# Patient Record
Sex: Female | Born: 2000 | Race: White | Hispanic: No | Marital: Single | Smoking: Never smoker
Health system: Southern US, Community
[De-identification: ages and names within clinical notes are randomized; demographics above are authoritative.]

## PROBLEM LIST (undated history)

## (undated) ENCOUNTER — Ambulatory Visit: Admission: EM | Payer: Medicaid Other | Source: Home / Self Care

## (undated) DIAGNOSIS — F419 Anxiety disorder, unspecified: Secondary | ICD-10-CM

## (undated) HISTORY — PX: TONGUE SURGERY: SHX810

## (undated) HISTORY — DX: Anxiety disorder, unspecified: F41.9

---
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
## 2021-07-06 ENCOUNTER — Ambulatory Visit
Admission: EM | Admit: 2021-07-06 | Discharge: 2021-07-06 | Disposition: A | Discharge status: Home / Self Care | Payer: Medicaid Other | Attending: Student | Admitting: Student

## 2021-07-06 ENCOUNTER — Other Ambulatory Visit: Payer: Self-pay

## 2021-07-06 VITALS — BP 123/87 | HR 94 | Temp 98.8°F | Resp 16

## 2021-07-06 DIAGNOSIS — N3000 Acute cystitis without hematuria: Secondary | ICD-10-CM

## 2021-07-06 LAB — URINALYSIS, ROUTINE W REFLEX MICROSCOPIC
Bilirubin Urine: NEGATIVE
Glucose, UA: NEGATIVE mg/dL
Hgb urine dipstick: NEGATIVE
Ketones, ur: NEGATIVE mg/dL
Nitrite: POSITIVE — AB
Specific Gravity, Urine: 1.02 (ref 1.005–1.030)
pH: 7.5 (ref 5.0–8.0)

## 2021-07-06 LAB — URINALYSIS, MICROSCOPIC (REFLEX): WBC, UA: >50 WBC/hpf (ref 0–5)

## 2021-07-06 MED ORDER — NITROFURANTOIN MONOHYD MACRO 100 MG PO CAPS: 100.0000 mg | ORAL_CAPSULE | Freq: Two times a day (BID) | ORAL | 0 refills | Status: AC

## 2021-07-06 MED ORDER — FLUCONAZOLE 150 MG PO TABS: 150.0000 mg | ORAL_TABLET | Freq: Every day | ORAL | 0 refills | Status: AC

## 2021-07-06 NOTE — ED Triage Notes (Signed)
Pt reports burning when urinating, lower back pain and increased urinary frequency x 2 days.  ?

## 2021-07-06 NOTE — ED Provider Notes (Signed)
?[facility] ? ? ? ?CSN: [id number] ?Arrival date & time: 07/06/21  1700 ? ? ?  ? ?History   ?Chief Complaint ?Chief Complaint  ?Patient presents with  ? Dysuria  ? ? ?HPI ?Kristin Holt is a 21 y.o. female presenting with dysuria and frequency for 2 days following treatment for BV with metronidazole cream.  History UTI greater than 3 years ago.  Describes dysuria, frequency, lower back pain.  Denies hematuria, abdominal pain, fever/chills, vaginal rash or lesion.  States her BV symptoms cleared up, and she does not have any vaginal discharge or odor at time of visit.  Denies partners, states she is not sexually active.  OCP contraception ? ?HPI ? ?Past Medical History:  ?Diagnosis Date  ? Anxiety   ? ? ?Patient Active Problem List  ? Diagnosis Date Noted  ? 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 Migraine without aura and without status migrainosus, not intractable 07/28/2018  ? Complicated migraine 07/28/2018  ? Episodic tension-type headache, not intractable 07/28/2018  ? Poor sleep hygiene 07/28/2018  ? ? ?Past Surgical History:  ?Procedure Laterality Date  ? TONGUE SURGERY    ? ? ?OB History   ? ? Gravida  ?0  ? Para  ?0  ? Term  ?0  ? Preterm  ?0  ? AB  ?0  ? Living  ?0  ?  ? ? SAB  ?0  ? IAB  ?0  ? Ectopic  ?0  ? Multiple  ?0  ? Live Births  ?0  ?   ?  ?  ? ? ? ?Home Medications   ? ?Prior to Admission medications   ?Medication Sig Start Date End Date Taking? Authorizing Provider  ?fluconazole (DIFLUCAN) 150 MG tablet Take 1 tablet (150 mg total) by mouth daily. If symptoms develop - take one pill of diflucan. If symptoms persist, take second pill 3 days later. 07/06/21  Yes Rhys Martini, PA-C  ?nitrofurantoin, macrocrystal-monohydrate, (MACROBID) 100 MG capsule Take 1 capsule (100 mg total) by mouth 2 (two) times daily. 07/06/21  Yes Rhys Martini, PA-C  ?TRI-SPRINTEC 0.18/0.215/0.25 MG-35 MCG tablet Take 1 tablet by mouth daily. 10/29/19   [provider]  ? ? ?Family History ?Family History  ?Problem  Relation Age of Onset  ? Migraines Mother   ? Migraines Paternal Grandmother   ? Breast cancer Paternal Grandmother   ?     twice  ? ? ?Social History ?Social History  ? ?Tobacco Use  ? Smoking status: Never  ? Smokeless tobacco: Never  ?Vaping Use  ? Vaping Use: Never used  ?Substance Use Topics  ? Alcohol use: No  ? Drug use: Never  ? ? ? ?Allergies   ?Amoxicillin-pot clavulanate ? ? ?Review of Systems ?Review of Systems  ?Constitutional:  Negative for appetite change, chills, diaphoresis and fever.  ?Respiratory:  Negative for shortness of breath.   ?Cardiovascular:  Negative for chest pain.  ?Gastrointestinal:  Negative for abdominal pain, blood in stool, constipation, diarrhea, nausea and vomiting.  ?Genitourinary:  Positive for dysuria. Negative for decreased urine volume, difficulty urinating, flank pain, frequency, genital sores, hematuria and urgency.  ?Musculoskeletal:  Negative for back pain.  ?Neurological:  Negative for dizziness, weakness and light-headedness.  ?All other systems reviewed and are negative. ? ? ?Physical Exam ?Triage Vital Signs ?ED Triage Vitals  ?Enc Vitals Group  ?   BP 07/06/21 1726 123/87  ?   Pulse Rate 07/06/21 1726 94  ?   Resp 07/06/21 1726  16  ?   Temp 07/06/21 1726 98.8 ?F (37.1 ?C)  ?   Temp Source 07/06/21 1726 Oral  ?   SpO2 07/06/21 1726 98 %  ?   Weight --   ?   Height --   ?   Head Circumference --   ?   Peak Flow --   ?   Pain Score 07/06/21 1727 8  ?   Pain Loc --   ?   Pain Edu? --   ?   Excl. in GC? --   ? ?No data found. ? ?Updated Vital Signs ?BP 123/87 (BP Location: Left Arm)   Pulse 94   Temp 98.8 ?F (37.1 ?C) (Oral)   Resp 16   SpO2 98%  ? ?Visual Acuity ?Right Eye Distance:   ?Left Eye Distance:   ?Bilateral Distance:   ? ?Right Eye Near:   ?Left Eye Near:    ?Bilateral Near:    ? ?Physical Exam ?Vitals reviewed.  ?Constitutional:   ?   General: She is not in acute distress. ?   Appearance: Normal appearance. She is not ill-appearing.  ?HENT:  ?   Head:  Normocephalic and atraumatic.  ?   Mouth/Throat:  ?   Mouth: Mucous membranes are moist.  ?   Comments: Moist mucous membranes ?Eyes:  ?   Extraocular Movements: Extraocular movements intact.  ?   Pupils: Pupils are equal, round, and reactive to light.  ?Cardiovascular:  ?   Rate and Rhythm: Normal rate and regular rhythm.  ?   Heart sounds: Normal heart sounds.  ?Pulmonary:  ?   Effort: Pulmonary effort is normal.  ?   Breath sounds: Normal breath sounds. No wheezing, rhonchi or rales.  ?Abdominal:  ?   General: Bowel sounds are normal. There is no distension.  ?   Palpations: Abdomen is soft. There is no mass.  ?   Tenderness: There is no abdominal tenderness. There is no right CVA tenderness, left CVA tenderness, guarding or rebound.  ?Skin: ?   General: Skin is warm.  ?   Capillary Refill: Capillary refill takes less than 2 seconds.  ?   Comments: Good skin turgor  ?Neurological:  ?   General: No focal deficit present.  ?   Mental Status: She is alert and oriented to person, place, and time.  ?Psychiatric:     ?   Mood and Affect: Mood normal.     ?   Behavior: Behavior normal.  ? ? ? ?UC Treatments / Results  ?Labs ?(all labs ordered are listed, but only abnormal results are displayed) ?Labs Reviewed  ?URINALYSIS, ROUTINE W REFLEX MICROSCOPIC - Abnormal; Notable for the following components:  ?    Result Value  ? Protein, ur TRACE (*)   ? Nitrite POSITIVE (*)   ? Leukocytes,Ua SMALL (*)   ? All other components within normal limits  ?URINALYSIS, MICROSCOPIC (REFLEX) - Abnormal; Notable for the following components:  ? Bacteria, UA FEW (*)   ? All other components within normal limits  ?URINE CULTURE  ? ? ?EKG ? ? ?Radiology ?No results found. ? ?Procedures ?Procedures (including critical care time) ? ?Medications Ordered in UC ?Medications - No data to display ? ?Initial Impression / Assessment and Plan / UC Course  ?I have reviewed the triage vital signs and the nursing notes. ? ?Pertinent labs & imaging  results that were available during my care of the patient were reviewed by me and considered in my  medical decision making (see chart for details). ? ?  ? ?This patient is a very pleasant 21 y.o. year old female presenting with acute cystitis. Afebrile, nontachycardic, no reproducible abd pain or CVAT. ? ?UA with positive nitrite and small leuk. Last Azo 1 day ago. Culture sent.  ? ?Will manage as UTI with Macrobid as she is penicillin allergic. Diflucan sent to have on hand.  ? ?ED return precautions discussed. Patient verbalizes understanding and agreement.  ?.  ? ?Final Clinical Impressions(s) / UC Diagnoses  ? ?Final diagnoses:  ?Acute cystitis without hematuria  ? ? ? ?Discharge Instructions   ? ?  ?-Macrobid twice daily x5 days ?-Drink plenty of fluids  ?-Diflucan if you develop white discharge and/or itchy rash.  ?-Follow-up with us or gyn if symptoms persist or new symptoms. ? ? ?ED Prescriptions   ? ? Medication Sig Dispense Auth. Provider  ? nitrofurantoin, macrocrystal-monohydrate, (MACROBID) 100 MG capsule Take 1 capsule (100 mg total) by mouth 2 (two) times daily. 10 capsule Rhys MartiniGraham, Fremon Zacharia E, PA-C  ? fluconazole (DIFLUCAN) 150 MG tablet Take 1 tablet (150 mg total) by mouth daily. If symptoms develop - take one pill of diflucan. If symptoms persist, take second pill 3 days later. 2 tablet Rhys MartiniGraham, Gerica Koble E, PA-C  ? ?  ? ?PDMP not reviewed this encounter. ?  ?Rhys MartiniGraham, Jawuan Robb E, PA-C ?07/06/21 1816 ? ?

## 2021-07-06 NOTE — Discharge Instructions (Signed)
-  Macrobid twice daily x5 days ?-Drink plenty of fluids  ?-Diflucan if you develop white discharge and/or itchy rash.  ?-Follow-up with [place] or gyn if symptoms persist or new symptoms. ?

## 2021-07-09 LAB — URINE CULTURE: Culture: 40000 — AB

## 2021-09-11 ENCOUNTER — Telehealth: Payer: Medicaid Other | Admitting: Family Medicine

## 2021-09-11 DIAGNOSIS — N3000 Acute cystitis without hematuria: Secondary | ICD-10-CM | POA: Diagnosis not present

## 2021-09-11 IMAGING — US US PELVIS COMPLETE WITH TRANSVAGINAL
1 series · 14 of 25 positions shown · non-contrast
Comparison: None

CLINICAL DATA: Dysmenorrhea and generalized pain for 1 year, LMP
10/03/2019

EXAM:
TRANSABDOMINAL AND TRANSVAGINAL ULTRASOUND OF PELVIS
TECHNIQUE: Both transabdominal and transvaginal ultrasound examinations of the
pelvis were performed. Transabdominal technique was performed for
global imaging of the pelvis including uterus, ovaries, adnexal
regions, and pelvic cul-de-sac. It was necessary to proceed with
endovaginal exam following the transabdominal exam to visualize the
uterus and cervix.

[Series 1: us pelvic complete with transvaginal · 14 of 86 slices shown]
[im 1/86]
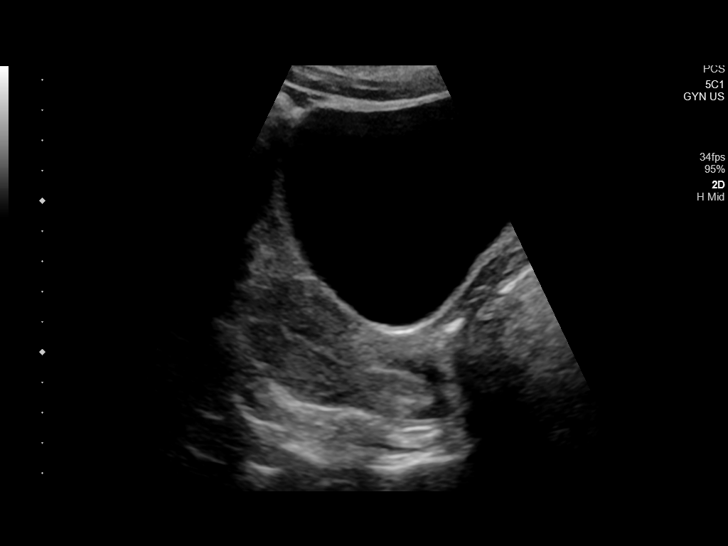
[im 8/86]
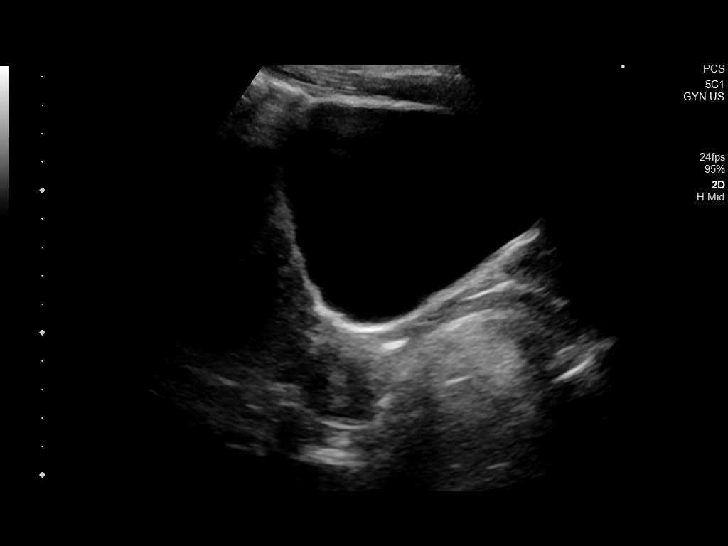
[im 15/86]
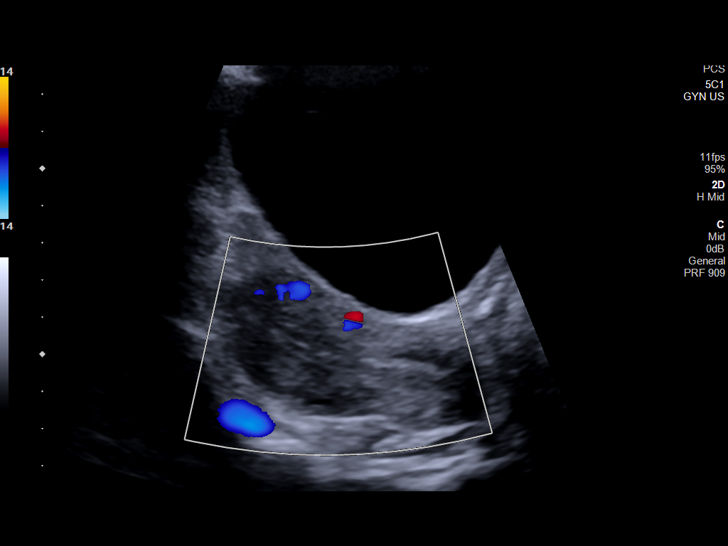
[im 22/86]
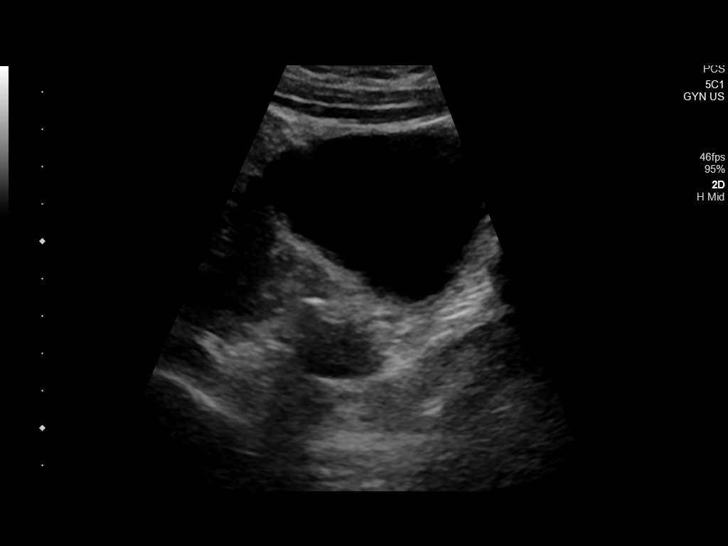
[im 29/86]
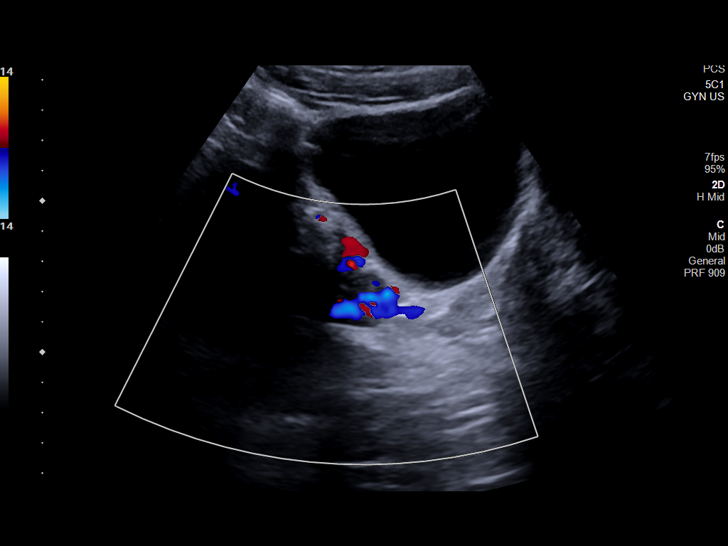
[im 32/86]
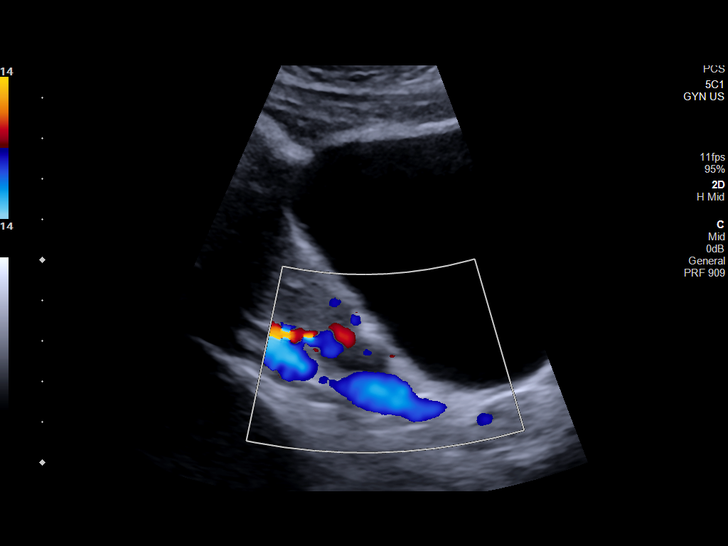
[im 39/86]
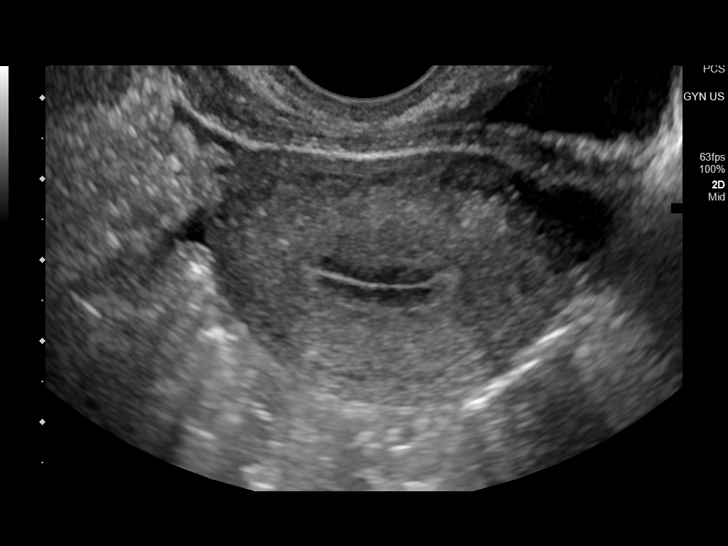
[im 47/86]
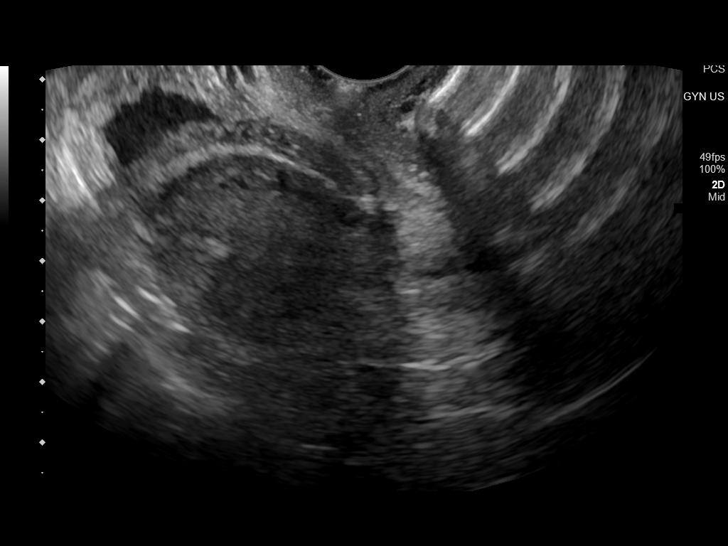
[im 54/86]
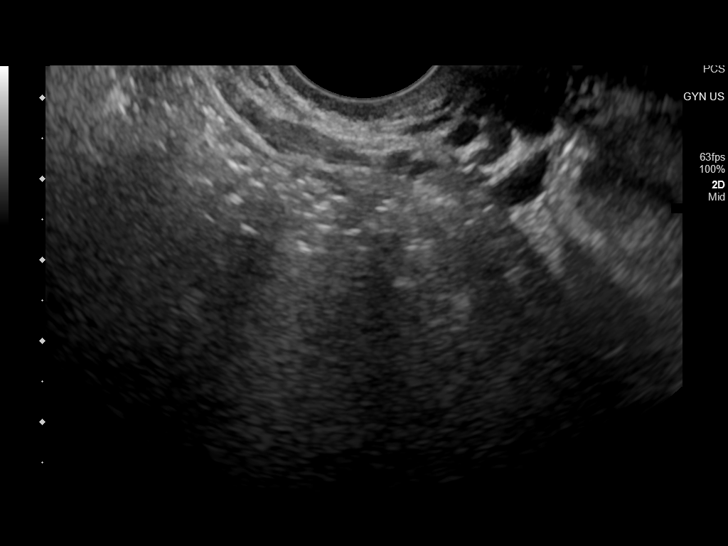
[im 57/86]
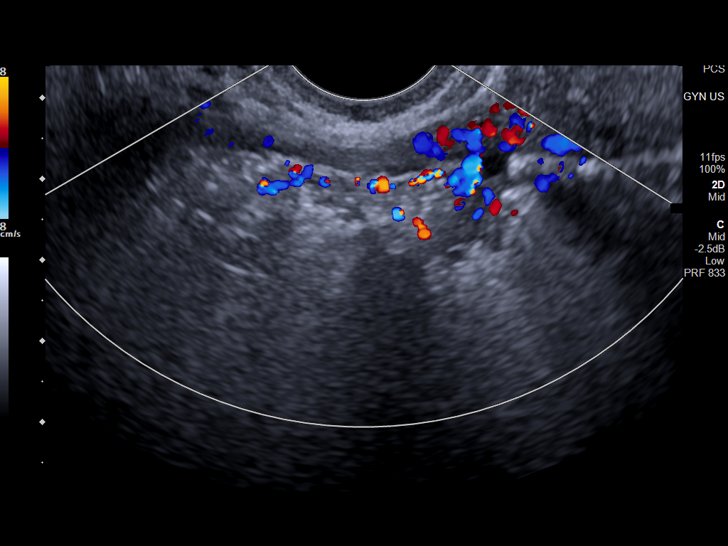
[im 64/86]
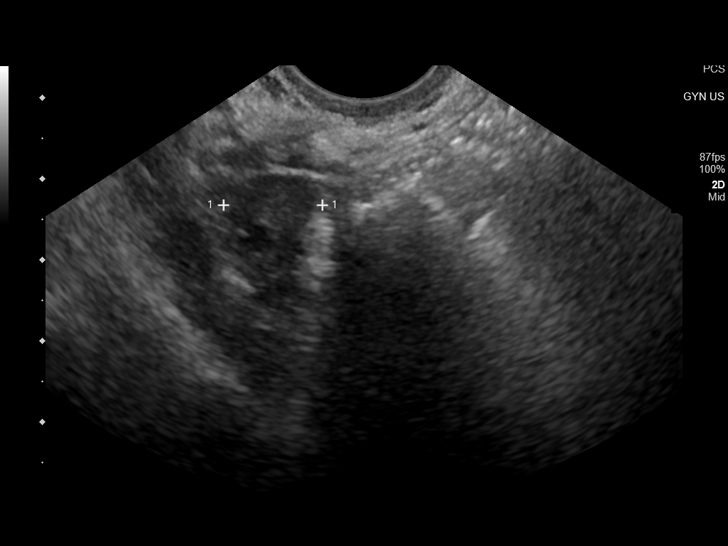
[im 71/86]
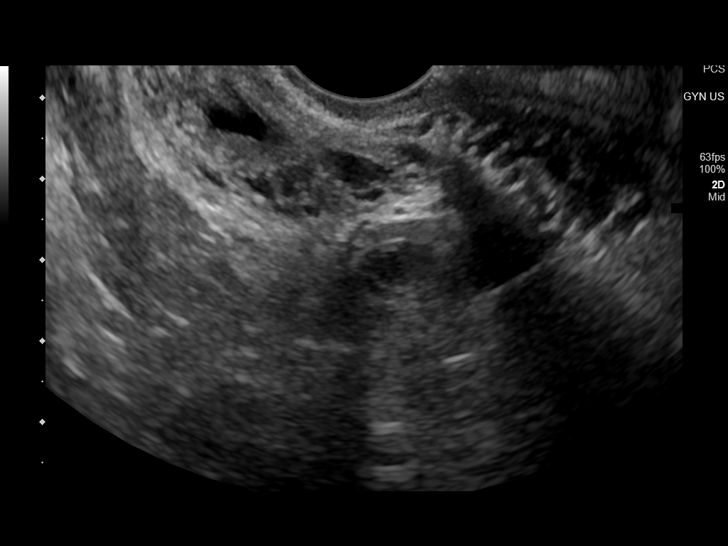
[im 78/86]
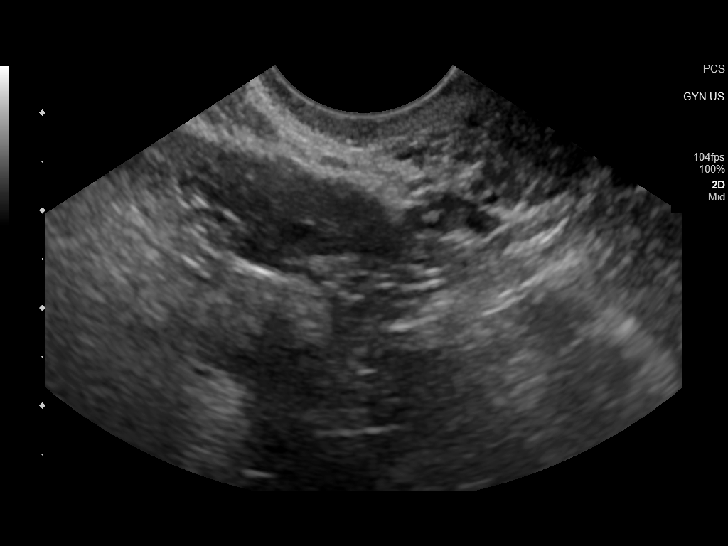
[im 86/86]
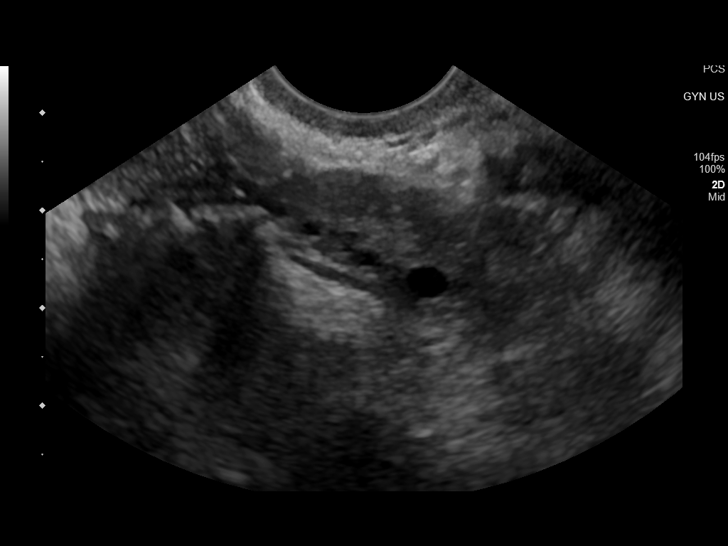

[14 of 25 positions shown; findings below may reference images not displayed]

FINDINGS: Uterus

Measurements: 7.7 x 3.7 x 4.6 cm = volume: 68 mL. Anteverted. Normal
morphology without mass.

Endometrium

Thickness: 7 mm.  No endometrial fluid or focal abnormality

Right ovary

Measurements: 1.7 x 2.1 x 1.2 cm = volume: 2.3 mL. Normal morphology
without mass

Left ovary

Measurements: 2.6 x 1.4 x 2.3 cm = volume: 4.2 mL. Normal morphology
without mass

Other findings

No free pelvic fluid.  No adnexal masses.
IMPRESSION: Normal exam.

## 2021-09-11 MED ORDER — NITROFURANTOIN MONOHYD MACRO 100 MG PO CAPS: 100.0000 mg | ORAL_CAPSULE | Freq: Two times a day (BID) | ORAL | 0 refills | Status: AC

## 2021-09-11 NOTE — Patient Instructions (Signed)
Urinary Tract Infection, Adult  A urinary tract infection (UTI) is an infection of any part of the urinary tract. The urinary tract includes the kidneys, ureters, bladder, and urethra. These organs make, store, and get rid of urine in the body. An upper UTI affects the ureters and kidneys. A lower UTI affects the bladder and urethra. What are the causes? Most urinary tract infections are caused by bacteria in your genital area around your urethra, where urine leaves your body. These bacteria grow and cause inflammation of your urinary tract. What increases the risk? You are more likely to develop this condition if: You have a urinary catheter that stays in place. You are not able to control when you urinate or have a bowel movement (incontinence). You are female and you: Use a spermicide or diaphragm for birth control. Have low estrogen levels. Are pregnant. You have certain genes that increase your risk. You are sexually active. You take antibiotic medicines. You have a condition that causes your flow of urine to slow down, such as: An enlarged prostate, if you are female. Blockage in your urethra. A kidney stone. A nerve condition that affects your bladder control (neurogenic bladder). Not getting enough to drink, or not urinating often. You have certain medical conditions, such as: Diabetes. A weak disease-fighting system (immunesystem). Sickle cell disease. Gout. Spinal cord injury. What are the signs or symptoms? Symptoms of this condition include: Needing to urinate right away (urgency). Frequent urination. This may include small amounts of urine each time you urinate. Pain or burning with urination. Blood in the urine. Urine that smells bad or unusual. Trouble urinating. Cloudy urine. Vaginal discharge, if you are female. Pain in the abdomen or the lower back. You may also have: Vomiting or a decreased appetite. Confusion. Irritability or tiredness. A fever or  chills. Diarrhea. The first symptom in older adults may be confusion. In some cases, they may not have any symptoms until the infection has worsened. How is this diagnosed? This condition is diagnosed based on your medical history and a physical exam. You may also have other tests, including: Urine tests. Blood tests. Tests for STIs (sexually transmitted infections). If you have had more than one UTI, a cystoscopy or imaging studies may be done to determine the cause of the infections. How is this treated? Treatment for this condition includes: Antibiotic medicine. Over-the-counter medicines to treat discomfort. Drinking enough water to stay hydrated. If you have frequent infections or have other conditions such as a kidney stone, you may need to see a health care provider who specializes in the urinary tract (urologist). In rare cases, urinary tract infections can cause sepsis. Sepsis is a life-threatening condition that occurs when the body responds to an infection. Sepsis is treated in the hospital with IV antibiotics, fluids, and other medicines. Follow these instructions at home:  Medicines Take over-the-counter and prescription medicines only as told by your health care provider. If you were prescribed an antibiotic medicine, take it as told by your health care provider. Do not stop using the antibiotic even if you start to feel better. General instructions Make sure you: Empty your bladder often and completely. Do not hold urine for long periods of time. Empty your bladder after sex. Wipe from front to back after urinating or having a bowel movement if you are female. Use each tissue only one time when you wipe. Drink enough fluid to keep your urine pale yellow. Keep all follow-up visits. This is important. Contact a health   care provider if: Your symptoms do not get better after 1-2 days. Your symptoms go away and then return. Get help right away if: You have severe pain in  your back or your lower abdomen. You have a fever or chills. You have nausea or vomiting. Summary A urinary tract infection (UTI) is an infection of any part of the urinary tract, which includes the kidneys, ureters, bladder, and urethra. Most urinary tract infections are caused by bacteria in your genital area. Treatment for this condition often includes antibiotic medicines. If you were prescribed an antibiotic medicine, take it as told by your health care provider. Do not stop using the antibiotic even if you start to feel better. Keep all follow-up visits. This is important. This information is not intended to replace advice given to you by your health care provider. Make sure you discuss any questions you have with your health care provider. Document Revised: 11/02/2019 Document Reviewed: 11/02/2019 Elsevier Patient Education  2023 Elsevier Inc.  

## 2021-09-11 NOTE — Progress Notes (Signed)
Virtual Visit Consent   Kristin Holt, you are scheduled for a virtual visit with a [facility] provider today. Just as with appointments in the office, your consent must be obtained to participate. Your consent will be active for this visit and any virtual visit you may have with one of our providers in the next 365 days. If you have a MyChart account, a copy of this consent can be sent to you electronically.  As this is a virtual visit, video technology does not allow for your provider to perform a traditional examination. This may limit your provider's ability to fully assess your condition. If your provider identifies any concerns that need to be evaluated in person or the need to arrange testing (such as labs, EKG, etc.), we will make arrangements to do so. Although advances in technology are sophisticated, we cannot ensure that it will always work on either your end or our end. If the connection with a video visit is poor, the visit may have to be switched to a telephone visit. With either a video or telephone visit, we are not always able to ensure that we have a secure connection.  By engaging in this virtual visit, you consent to the provision of healthcare and authorize for your insurance to be billed (if applicable) for the services provided during this visit. Depending on your insurance coverage, you may receive a charge related to this service.  I need to obtain your verbal consent now. Are you willing to proceed with your visit today? Kristin Holt has provided verbal consent on 09/11/2021 for a virtual visit (video or telephone). Kristin Curio, FNP  Date: 09/11/2021 12:30 PM  Virtual Visit via Video Note   I, Kristin Holt, connected with  Kristin Holt  ([id number], Jul 21, 2000) on 09/11/21 at 12:45 PM EDT by a video-enabled telemedicine application and verified that I am speaking with the correct person using two identifiers.  Location: Patient: Virtual Visit Location Patient: Home Provider:  Virtual Visit Location Provider: Home Office   I discussed the limitations of evaluation and management by telemedicine and the availability of in person appointments. The patient expressed understanding and agreed to proceed.    History of Present Illness: Kristin Holt is a 21 y.o. who identifies as a female who was assigned female at birth, and is being seen today for dysuria, frequency for several days worsening. No fever. No back pain. 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  Resting comfortably  at home.  Head is normocephalic, atraumatic.  No labored breathing.  Speech is clear and coherent with logical content.  Patient is alert and oriented at baseline.    Assessment and Plan: 1. Acute cystitis without hematuria  Increase fluids, preventative measures discussed. Follow up at urgent care as needed.   Follow Up Instructions: I discussed the assessment and treatment plan with the patient. The patient was provided an opportunity to ask questions and all were answered. The patient agreed with the plan and demonstrated an understanding of the instructions.  A copy of instructions were sent  to the patient via MyChart unless otherwise noted below.     The patient was advised to call back or seek an in-person evaluation if the symptoms worsen or if the condition fails to improve as anticipated.  Time:  I spent 10 minutes with the patient via telehealth technology discussing the above problems/concerns.     Kristin Curio, FNP

## 2021-10-30 ENCOUNTER — Other Ambulatory Visit: Payer: Self-pay | Admitting: Family Medicine

## 2021-10-30 DIAGNOSIS — H5500 Unspecified nystagmus: Secondary | ICD-10-CM

## 2021-10-30 DIAGNOSIS — G43809 Other migraine, not intractable, without status migrainosus: Secondary | ICD-10-CM

## 2021-11-12 ENCOUNTER — Ambulatory Visit
Admission: RE | Admit: 2021-11-12 | Discharge: 2021-11-12 | Disposition: A | Discharge status: Home / Self Care | Payer: Medicaid Other | Source: Ambulatory Visit | Attending: Family Medicine | Admitting: Family Medicine

## 2021-11-12 DIAGNOSIS — G43809 Other migraine, not intractable, without status migrainosus: Secondary | ICD-10-CM

## 2021-11-12 DIAGNOSIS — H5500 Unspecified nystagmus: Secondary | ICD-10-CM

## 2021-11-24 ENCOUNTER — Other Ambulatory Visit: Payer: Medicaid Other

## 2022-03-17 ENCOUNTER — Other Ambulatory Visit: Payer: Medicaid Other

## 2022-04-20 ENCOUNTER — Other Ambulatory Visit: Payer: Self-pay | Admitting: Physician Assistant

## 2022-04-20 DIAGNOSIS — H539 Unspecified visual disturbance: Secondary | ICD-10-CM

## 2022-04-20 DIAGNOSIS — R42 Dizziness and giddiness: Secondary | ICD-10-CM

## 2022-04-20 DIAGNOSIS — G43409 Hemiplegic migraine, not intractable, without status migrainosus: Secondary | ICD-10-CM

## 2022-04-27 ENCOUNTER — Ambulatory Visit
Admission: RE | Admit: 2022-04-27 | Discharge: 2022-04-27 | Disposition: A | Discharge status: Home / Self Care | Payer: Medicaid Other | Source: Ambulatory Visit | Attending: Physician Assistant | Admitting: Physician Assistant

## 2022-04-27 ENCOUNTER — Other Ambulatory Visit: Payer: Self-pay | Admitting: Physician Assistant

## 2022-04-27 DIAGNOSIS — H539 Unspecified visual disturbance: Secondary | ICD-10-CM | POA: Diagnosis present

## 2022-04-27 DIAGNOSIS — R42 Dizziness and giddiness: Secondary | ICD-10-CM | POA: Insufficient documentation

## 2022-04-27 DIAGNOSIS — G43409 Hemiplegic migraine, not intractable, without status migrainosus: Secondary | ICD-10-CM | POA: Diagnosis not present

## 2022-08-29 ENCOUNTER — Ambulatory Visit
Admission: EM | Admit: 2022-08-29 | Discharge: 2022-08-29 | Disposition: A | Discharge status: Home / Self Care | Payer: Medicaid Other

## 2022-08-29 DIAGNOSIS — J029 Acute pharyngitis, unspecified: Secondary | ICD-10-CM

## 2022-08-29 DIAGNOSIS — J069 Acute upper respiratory infection, unspecified: Secondary | ICD-10-CM | POA: Diagnosis not present

## 2022-08-29 NOTE — ED Provider Notes (Signed)
Kristin Holt    CSN: [id number] Arrival date & time: 08/29/22  1107      History   Chief Complaint Chief Complaint  Patient presents with   Sore Throat    bad throat pain. hurts to swallow, body aches, headache, runny nose - Entered by patient   Generalized Body Aches   Headache    HPI Kristin Holt is a 22 y.o. female.    Sore Throat Associated symptoms include headaches.  Headache   Patient presents to urgent care with complaint of throat irritation x 2 days..  She endorses sore throat, body aches, headache last night.  She also reports mild nasal congestion with postnasal drip.  Denies rhinorrhea.  Denies fever.  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 Authorizing Provider  fluconazole (DIFLUCAN) 150 MG tablet Take 1 tablet (150 mg total) by mouth daily. If symptoms develop - take one pill of diflucan. If symptoms persist, take second pill 3 days later. 07/06/21   Rhys Martini, PA-C  nitrofurantoin, macrocrystal-monohydrate, (MACROBID) 100 MG capsule Take 1 capsule (100 mg total) by mouth 2 (two) times daily. 07/06/21   Rhys Martini, PA-C  TRI-SPRINTEC 0.18/0.215/0.25 MG-35 MCG tablet Take 1 tablet by mouth daily. 10/29/19   [provider]    Family History Family History  Problem Relation Age  of Onset   Migraines Mother    Migraines Paternal Grandmother    Breast cancer Paternal Grandmother        twice    Social History Social History   Tobacco Use   Smoking status: Never   Smokeless tobacco: Never  Vaping Use   Vaping Use: Never used  Substance Use Topics   Alcohol use: No   Drug use: Never     Allergies   Amoxicillin-pot clavulanate   Review of Systems Review of Systems  Neurological:  Positive for headaches.     Physical Exam Triage Vital Signs ED Triage Vitals  Enc Vitals Group     BP 08/29/22 1154 122/85     Pulse Rate 08/29/22 1154 84     Resp 08/29/22 1154 16     Temp 08/29/22 1154 98.3 F (36.8 C)     Temp Source 08/29/22 1154 Oral     SpO2 08/29/22 1154 98 %     Weight --      Height --      Head Circumference --      Peak Flow --      Pain Score 08/29/22 1156 7     Pain Loc --  Pain Edu? --      Excl. in GC? --    No data found.  Updated Vital Signs BP 122/85 (BP Location: Left Arm)   Pulse 84   Temp 98.3 F (36.8 C) (Oral)   Resp 16   SpO2 98%   Visual Acuity Right Eye Distance:   Left Eye Distance:   Bilateral Distance:    Right Eye Near:   Left Eye Near:    Bilateral Near:     Physical Exam Vitals reviewed.  Constitutional:      Appearance: She is well-developed.  HENT:     Nose: Congestion present.     Mouth/Throat:     Mouth: Mucous membranes are moist.     Pharynx: Posterior oropharyngeal erythema present. No oropharyngeal exudate.     Tonsils: No tonsillar exudate. 0 on the right. 0 on the left.  Skin:    General: Skin is warm and dry.  Neurological:     General: No focal deficit present.     Mental Status: She is alert and oriented to person, place, and time.  Psychiatric:        Mood and Affect: Mood normal.        Behavior: Behavior normal.      UC Treatments / Results  Labs (all labs ordered are listed, but only abnormal results are displayed) Labs Reviewed - No data to  display  EKG   Radiology No results found.  Procedures Procedures (including critical care time)  Medications Ordered in UC Medications - No data to display  Initial Impression / Assessment and Plan / UC Course  I have reviewed the triage vital signs and the nursing notes.  Pertinent labs & imaging results that were available during my care of the patient were reviewed by me and considered in my medical decision making (see chart for details).   Kristin Holt is a 22 y.o. female presenting with sore throat. Patient is afebrile without recent antipyretics, satting well on room air. Overall is well appearing though non-toxic, well hydrated, without respiratory distress.  Mild postpharyngeal erythema.  No peritonsillar exudates.  Reviewed relevant chart history  Patient symptoms are most consistent with an acute viral process resulting in postnasal drip causing her sore throat.  Recommending use of nasal decongestant sprays including Flonase and azelastine.  Also recommend supportive care and use of conservative management of her symptoms.  Counseled patient on potential for adverse effects with medications prescribed/recommended today, ER and return-to-clinic precautions discussed, patient verbalized understanding and agreement with care plan.  Final Clinical Impressions(s) / UC Diagnoses   Final diagnoses:  None   Discharge Instructions   None    ED Prescriptions   None    PDMP not reviewed this encounter.   Charma Igo, [place] 08/29/22 1229

## 2022-08-29 NOTE — Discharge Instructions (Addendum)
Recommend use of nasal decongestant sprays to control the postnasal drip which is the most likely contributing factor to your sore throat.  Suggest fluticasone (Flonase) and azelastine (Astepro), both of which are available over-the-counter.  Also recommend that you use ibuprofen for your throat pain, gargle with salt water, suck on hard candy or use throat sprays to relieve your symptoms.

## 2022-08-29 NOTE — ED Triage Notes (Signed)
Pt states Friday she had throat irritation. The patient states she had a sore throat, body aches, and headache last night.  Home interventions: none

## 2022-11-12 IMAGING — US US BREAST*R* LIMITED INC AXILLA
2 series · 13 of 13 positions shown · non-contrast
Comparison: Previous exam(s).

CLINICAL DATA: 20-year-old female presenting for six-month
follow-up of the right breast. Family history of breast cancer in
both maternal and paternal sides.

EXAM:
ULTRASOUND OF THE RIGHT BREAST

[Series 1: us breast*right* limited inc axilla · 0.06mm/px · 12 acquisitions, 12 frames shown (1 of 2)]
[im 1/12]
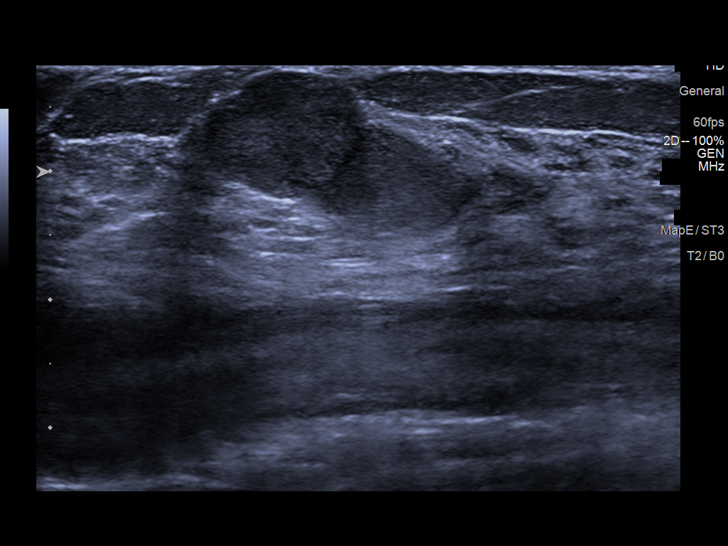
[im 2/12]
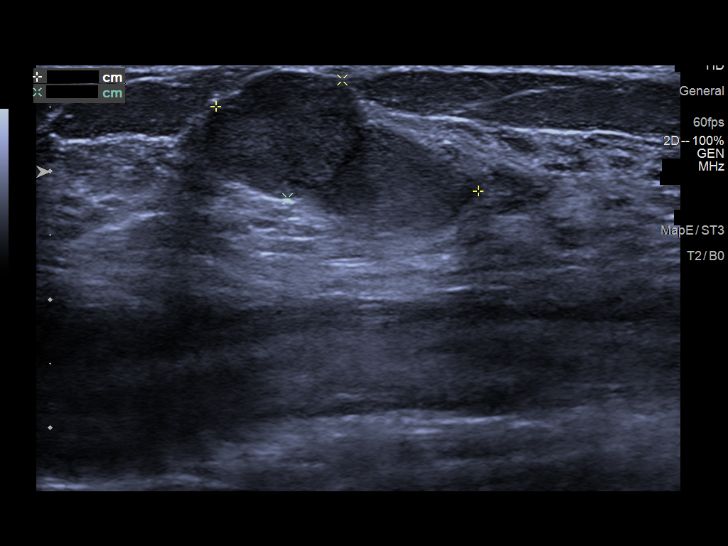
[im 3/12]
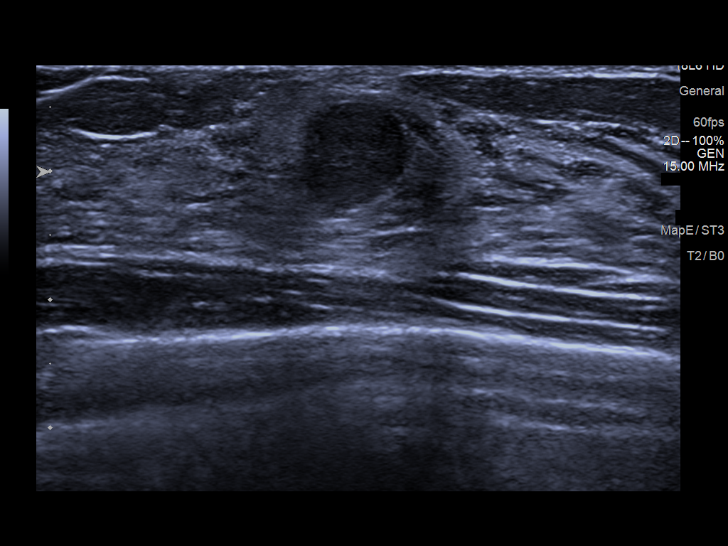
[im 4/12]
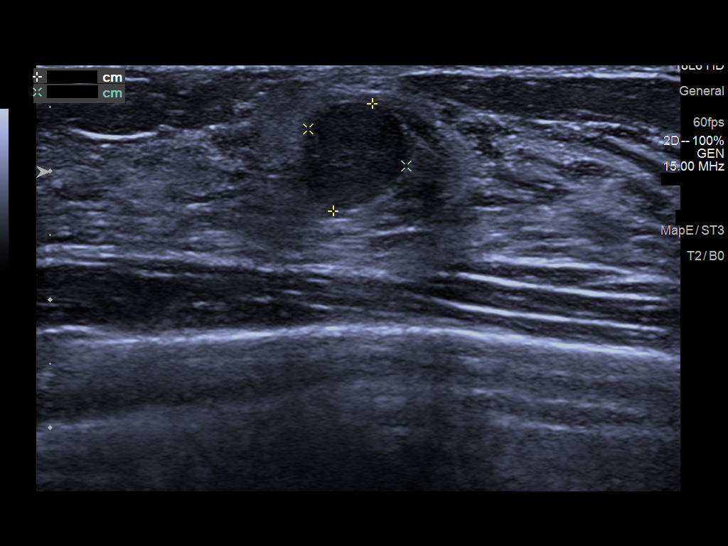
[im 5/12]
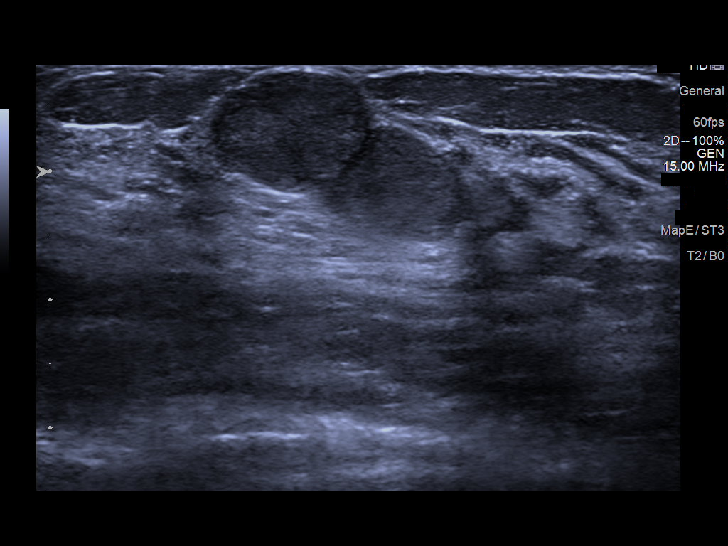
[im 6/12]
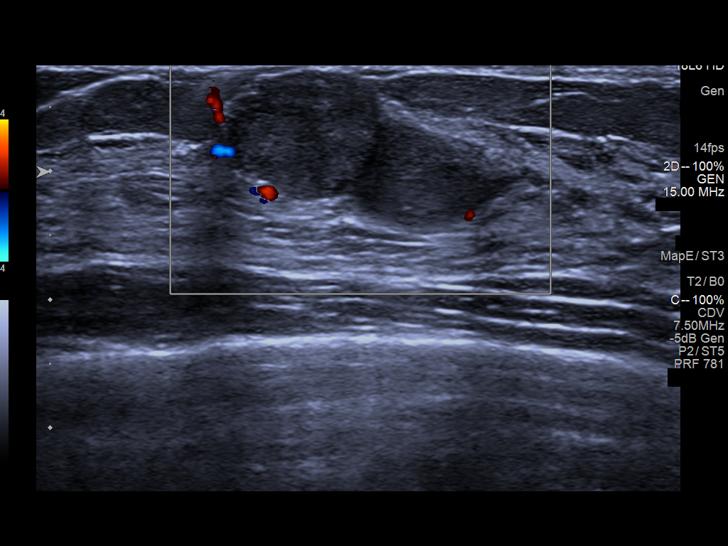
[im 7/12]
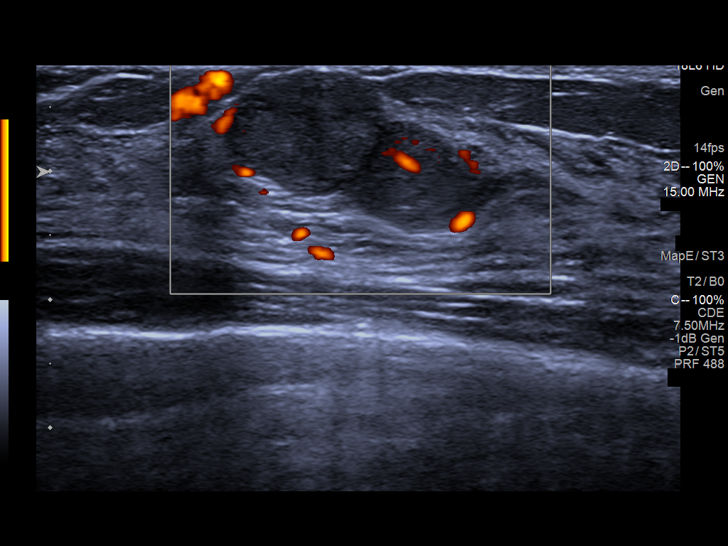
[im 8/12]
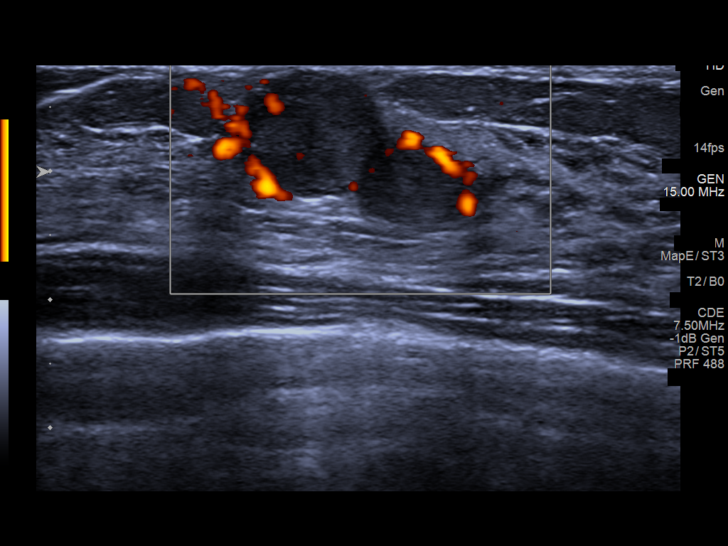
[im 9/12]
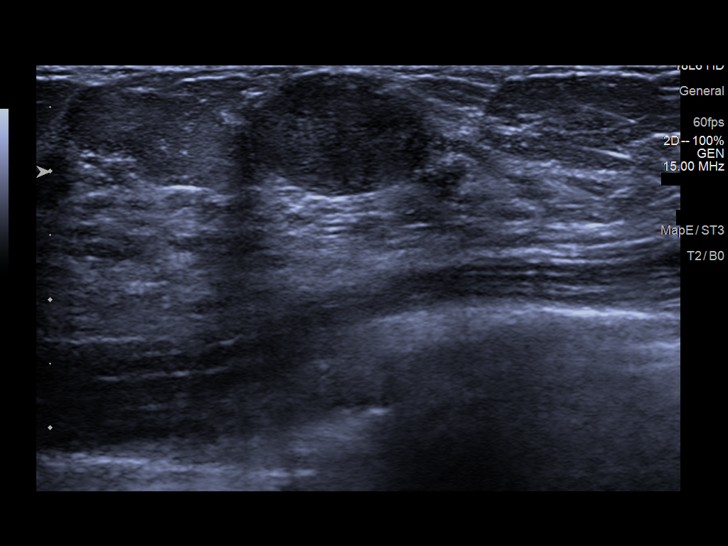
[im 10/12]
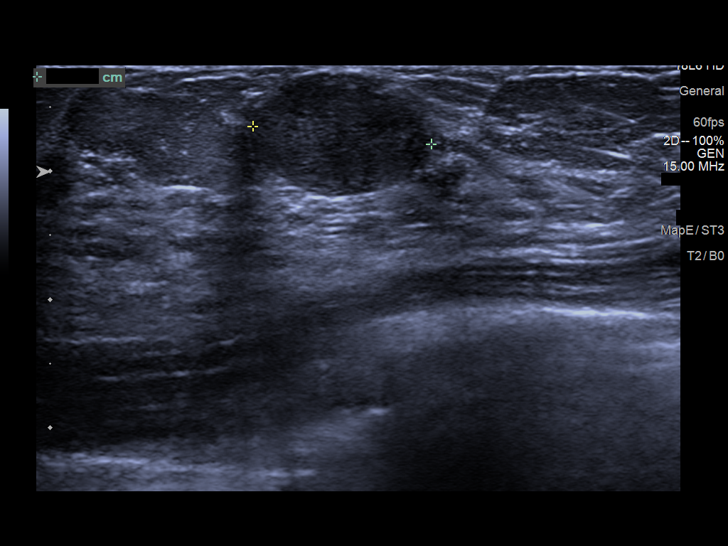
[im 11/12]
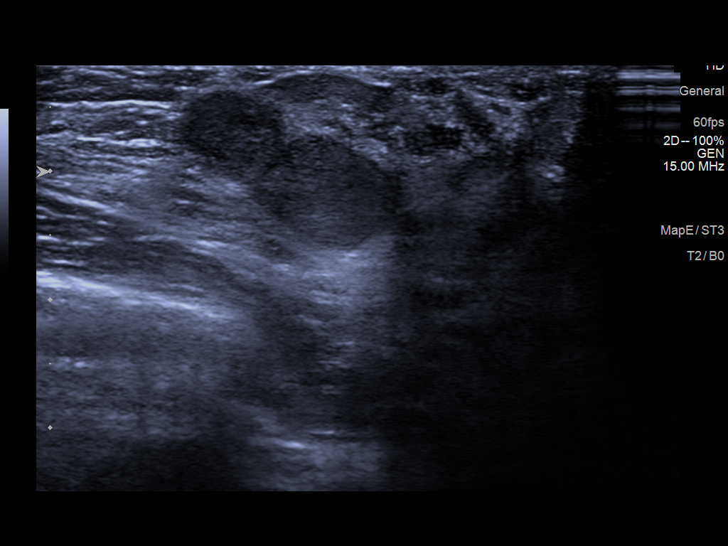
[im 12/12]
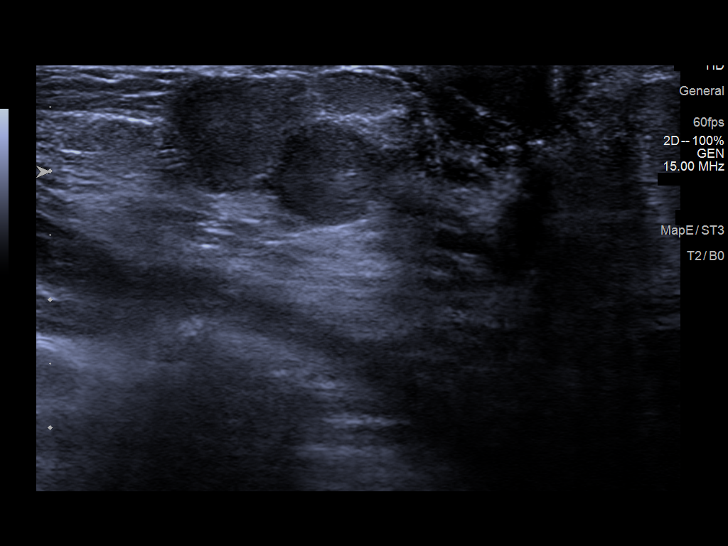

[Series 2: us breast*right* limited inc axilla · 0.06mm/px · 1 of 1 slices shown (2 of 2)]
[im 1/1]
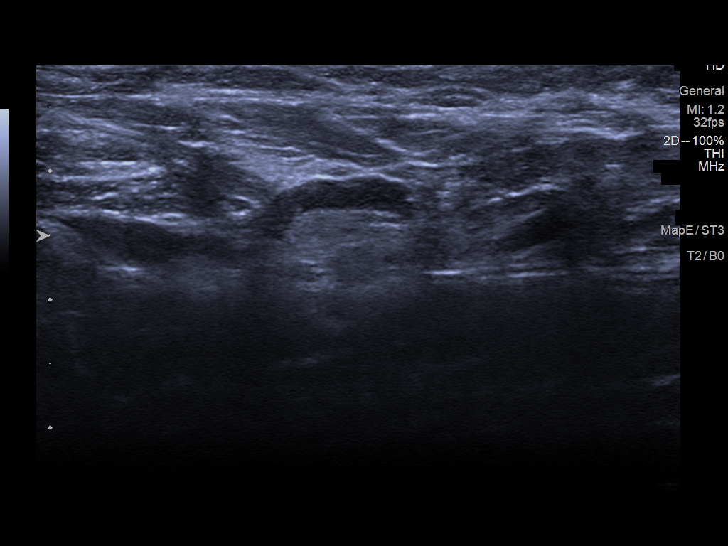

[13 of 13 positions shown; findings below may reference images not displayed]

FINDINGS: Targeted ultrasound is performed, showing a oval, circumscribed tri
lobed mass at the 8 o'clock position 1 cm from the nipple. Today,
the overall measurements are 2.2 x 1.0 x 1.4 cm (previously 1.4 x
0.9 x 1.0 cm). The overall mass is comprised of 3 adjacent
circumscribed masses. Evaluation of the right axilla demonstrates no
suspicious lymphadenopathy.
IMPRESSION: Indeterminate right breast mass. For recommendation is for
ultrasound-guided biopsy.

RECOMMENDATION:
Ultrasound-guided biopsy of the right breast.

I have discussed the findings and recommendations with the patient.
If applicable, a reminder letter will be sent to the patient
regarding the next appointment.

BI-RADS CATEGORY  4: Suspicious.

## 2022-11-25 IMAGING — MG US  BREAST BX W/ LOC DEV 1ST LESION IMG BX SPEC US GUIDE*R*
1 series · 8 of 8 positions shown · non-contrast
Comparison: Previous exam(s).
COMPARISON: Previous exam(s).

Addendum:
CLINICAL DATA: Palpable RIGHT breast mass, enlarging

EXAM:
ULTRASOUND GUIDED RIGHT BREAST CORE NEEDLE BIOPSY

[Series 1: MG view · 0.06mm/px · 8 of 13 slices shown]
[im 1/13]
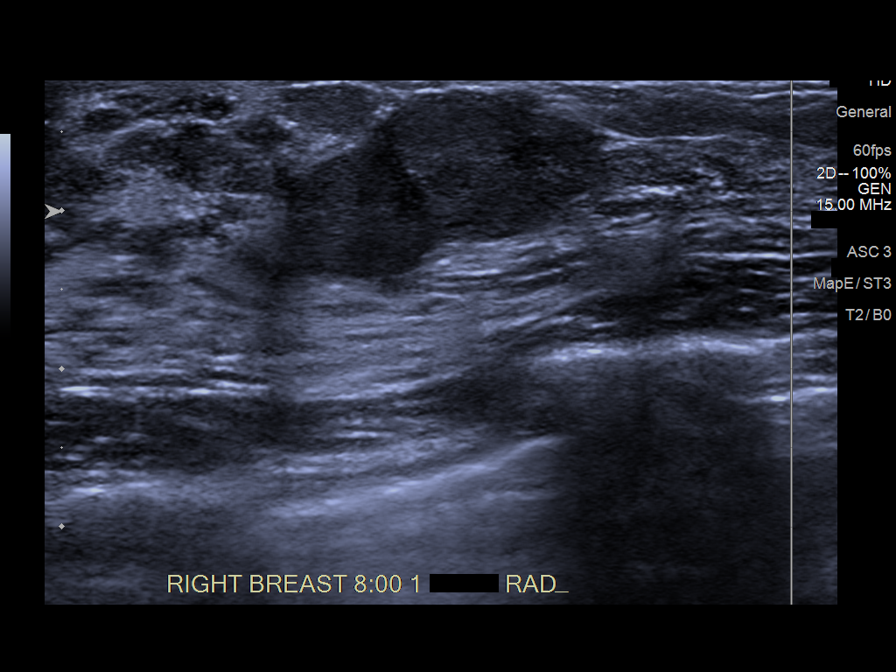
[im 2/13]
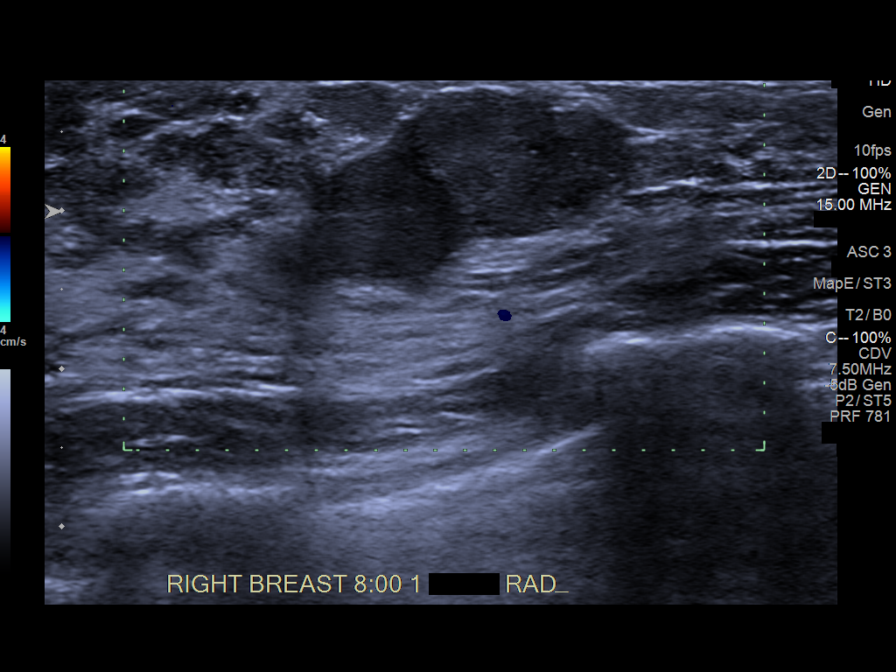
[im 4/13]
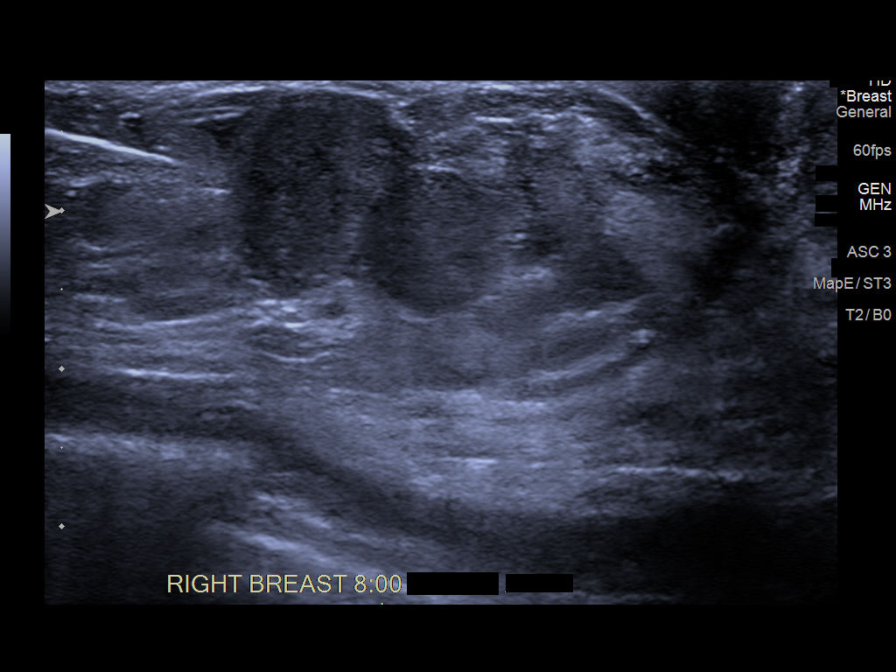
[im 6/13]
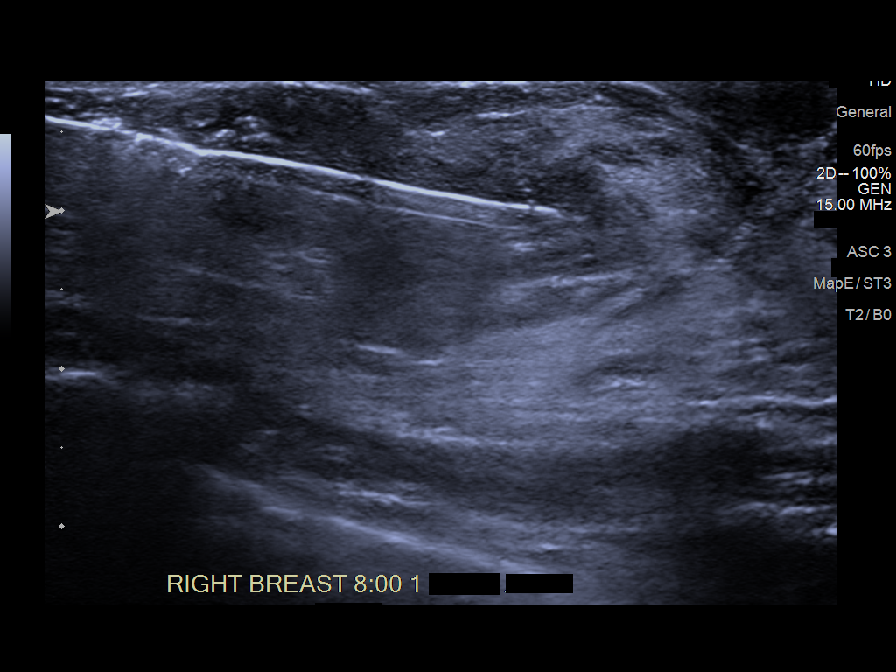
[im 7/13]
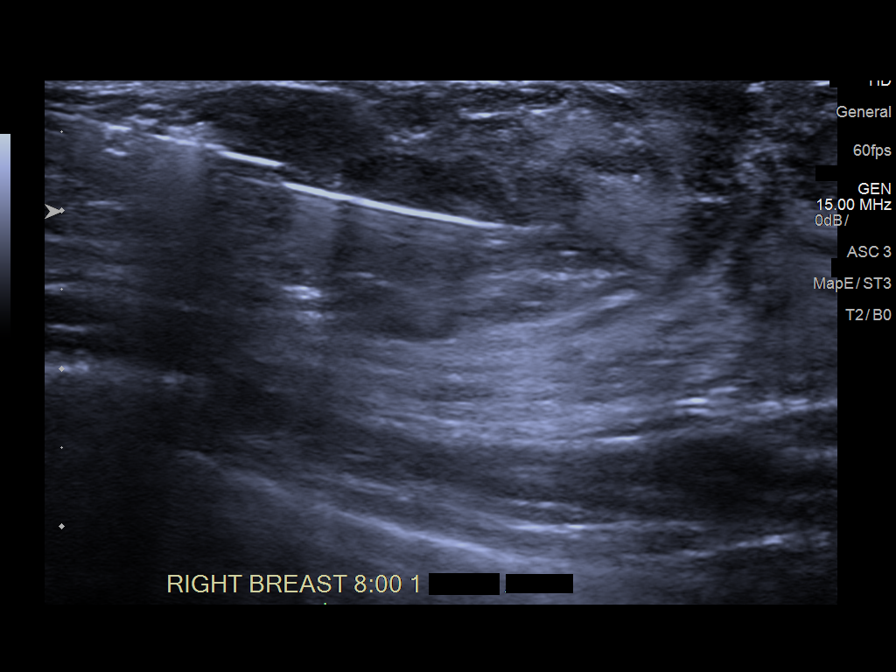
[im 9/13]
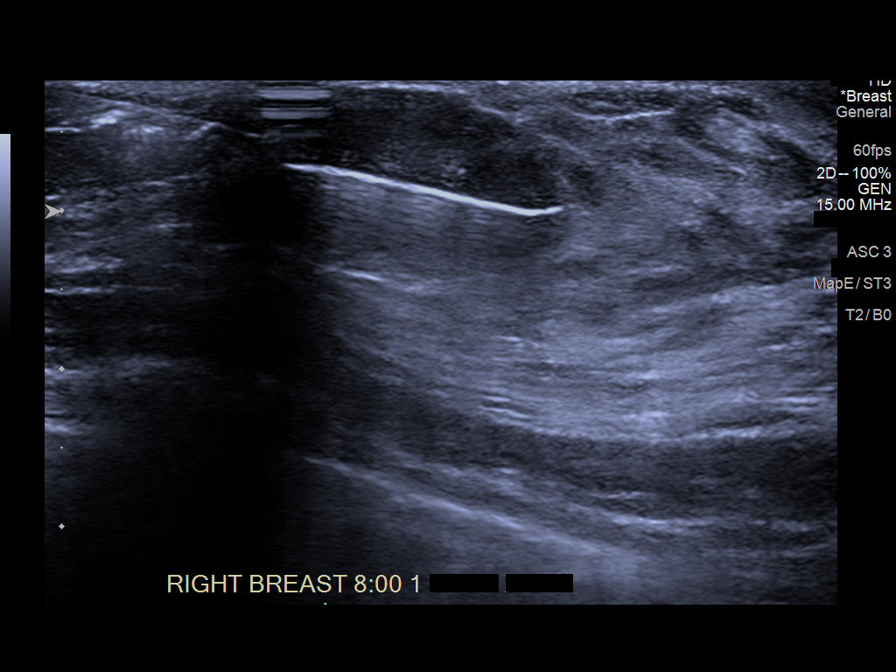
[im 11/13]
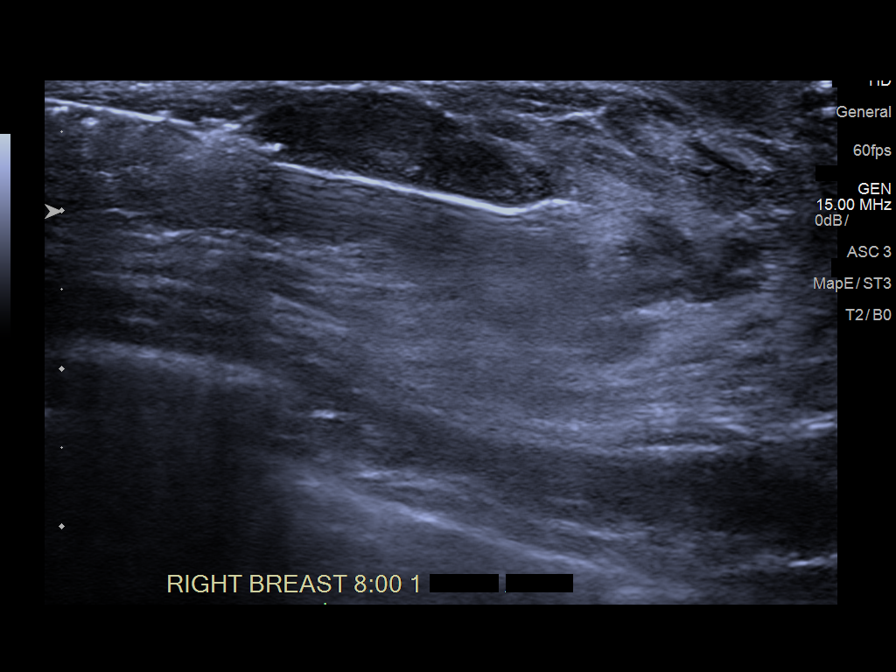
[im 13/13]
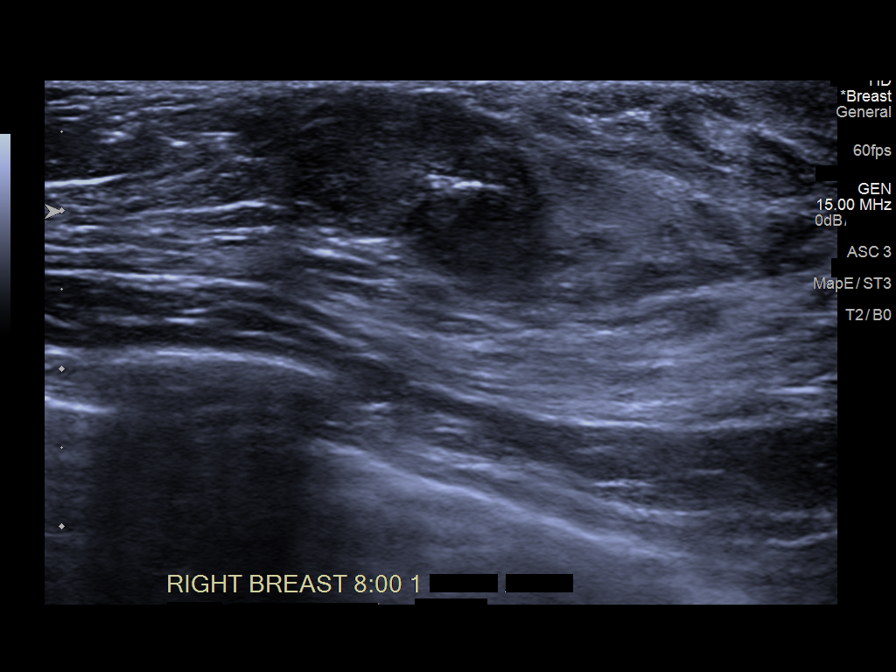

[8 of 8 positions shown; findings below may reference images not displayed]



Lesion quadrant: Lower outer quadrant

Using sterile technique and 1% lidocaine and 1% lidocaine with
epinephrine as local anesthetic, under direct ultrasound
visualization, a 14 gauge Tamang device was used to perform
biopsy of a mass at 8 o'clock 1 cm from the nipple using an
inferolateral approach. At the conclusion of the procedure a COIL
tissue marker clip was deployed into the biopsy cavity. Clip
placement was verified sonographically.
IMPRESSION: Ultrasound guided biopsy of a RIGHT breast mass. No apparent
complications.

ADDENDUM:
PATHOLOGY revealed: A. BREAST, RIGHT [DATE] 1 CM FN; ULTRASOUND-GUIDED
BIOPSY: - FIBROEPITHELIAL LESION CONSISTENT WITH FIBROADENOMA. -
NEGATIVE FOR ATYPIA AND MALIGNANCY.

Pathology results are CONCORDANT with imaging findings, per Dr.
Sefadin Pitafi.

Pathology results and recommendations were discussed with patient
via telephone on 12/26/2020. Patient reported doing well after the
biopsy with no adverse symptoms, and only slight tenderness at the
site. Post biopsy care instructions were reviewed, questions were
answered and my direct phone number was provided. Patient was
instructed to call [HOSPITAL] for any additional
questions or concerns related to biopsy site.

Recommendation: Patient instructed to continue with monthly self
breast examinations, clinical follow-up as needed, and begin
bilateral screening mammogram at age 41.

Pathology results reported by Che Wah Billie RN on 12/26/2020.



Lesion quadrant: Lower outer quadrant

Using sterile technique and 1% lidocaine and 1% lidocaine with
epinephrine as local anesthetic, under direct ultrasound
visualization, a 14 gauge Tamang device was used to perform
biopsy of a mass at 8 o'clock 1 cm from the nipple using an
inferolateral approach. At the conclusion of the procedure a COIL
tissue marker clip was deployed into the biopsy cavity. Clip
placement was verified sonographically.
IMPRESSION: Ultrasound guided biopsy of a RIGHT breast mass. No apparent
complications.

## 2023-01-08 ENCOUNTER — Ambulatory Visit: Admission: EM | Admit: 2023-01-08 | Discharge: 2023-01-08 | Payer: Medicaid Other

## 2023-01-08 ENCOUNTER — Emergency Department
Admission: EM | Admit: 2023-01-08 | Discharge: 2023-01-08 | Disposition: A | Discharge status: Home / Self Care | Payer: Medicaid Other | Attending: Emergency Medicine | Admitting: Emergency Medicine

## 2023-01-08 ENCOUNTER — Other Ambulatory Visit: Payer: Self-pay

## 2023-01-08 DIAGNOSIS — F419 Anxiety disorder, unspecified: Secondary | ICD-10-CM | POA: Diagnosis present

## 2023-01-08 MED ORDER — ONDANSETRON HCL 4 MG PO TABS: 4.0000 mg | ORAL_TABLET | Freq: Every day | ORAL | 1 refills | Status: AC | PRN

## 2023-01-08 NOTE — ED Provider Notes (Signed)
[facility] Provider Note    Event Date/Time   First MD Initiated Contact with Patient 01/08/23 1816     (approximate)   History   extreme hunger and Anxiety   HPI  Kristin Holt is a 22 y.o. female with PMH of anxiety presents for evaluation of extreme hunger x 4 days.  Patient states that she has hunger pains that do not go away when she tries to eat.  She is seen by her primary care provider 2 days ago and had blood work and a fecal test done.  She states she has severe anxiety surrounding throwing up.  She says her symptoms are worse at night that she gets anxious about the hunger pains starting and spirals from there.      Physical Exam   Triage Vital Signs: ED Triage Vitals  Encounter Vitals Group     BP 01/08/23 1657 (!) 143/108     Systolic BP Percentile --      Diastolic BP Percentile --      Pulse Rate 01/08/23 1657 (!) 116     Resp 01/08/23 1657 16     Temp 01/08/23 1657 98.4 F (36.9 C)     Temp Source 01/08/23 1657 Oral     SpO2 01/08/23 1657 98 %     Weight --      Height --      Head Circumference --      Peak Flow --      Pain Score 01/08/23 1705 0     Pain Loc --      Pain Education --      Exclude from Growth Chart --     Most recent vital signs: Vitals:   01/08/23 1657  BP: (!) 143/108  Pulse: (!) 116  Resp: 16  Temp: 98.4 F (36.9 C)  SpO2: 98%   General: Awake, anxious appearing, fidgeting in bed. CV:  Good peripheral perfusion.  RRR. Resp:  Normal effort.  CTAB. Abd:  No distention.    ED Results / Procedures / Treatments   Labs (all labs ordered are listed, but only abnormal results are displayed) Labs Reviewed - No data to display  PROCEDURES:  Critical Care performed: No  Procedures   MEDICATIONS ORDERED IN ED: Medications - No data to display   IMPRESSION / MDM / ASSESSMENT AND PLAN / ED COURSE  I reviewed the triage vital signs and the nursing notes.                              22 year old female presents for evaluation of hunger pains.  Patient was hypertensive and tachycardic in triage, very anxious appearing on exam, unable to sit still in the bed.  Differential diagnosis includes, but is not limited to, eating disorder, anxiety, panic attack, gastritis, parasite.  Patient's presentation is most consistent with acute, uncomplicated illness.  Patient reviewed her recent blood work results from her primary care provider with me, CBC and CMP were all unremarkable.  She also showed me the results of her fecal test which was also normal.  I did offer patient a full abdominal workup including repeat blood work and a CT abdomen pelvis.  Patient declined at this time.  I had an extensive conversation with the patient surrounding her symptoms and I feel that patient's symptoms are best attributed to anxiety at this time.  I will send her some nausea medication and give  her information for GI follow-up.  I recommended that she follow-up with her primary care provider to discuss starting anxiety medication.  She was agreeable to plan, voiced understanding and was stable at discharge.       FINAL CLINICAL IMPRESSION(S) / ED DIAGNOSES   Final diagnoses:  Anxiety     Rx / DC Orders   ED Discharge Orders          Ordered    ondansetron (ZOFRAN) 4 MG tablet  Daily PRN        01/08/23 1951             Note:  This document was prepared using Dragon voice recognition software and may include unintentional dictation errors.   Cameron Ali, PA-C 01/08/23 1952    Janith Lima, MD 01/09/23 1213

## 2023-01-08 NOTE — ED Triage Notes (Signed)
Pt states she's had episode of extreme hunger x4 days. Pt denies craving carbs/sugar, states the hunger makes her feels sick to her stomach. Pt was once prediabetic but reports last bloodwork was normal. Pt states the hunger makes her anxious and keeps her up at night, states she gets cold sweats at night. Fecal test was done per pt. Pt denies lymph node swelling, denies abdominal pain, diarrhea, emesis. Pt AOX4, NAD noted. Pt refusing blood work in triage at this time.

## 2023-01-08 NOTE — Discharge Instructions (Signed)
Please take the Zofran as needed.  Follow-up with your primary care provider to discuss management of your anxiety.  I have also attached information for a GI specialist, call the office to schedule an appointment

## 2023-02-24 ENCOUNTER — Other Ambulatory Visit: Payer: Self-pay

## 2023-02-24 ENCOUNTER — Emergency Department
Admission: EM | Admit: 2023-02-24 | Discharge: 2023-02-24 | Disposition: A | Discharge status: Home / Self Care | Payer: Medicaid Other | Attending: Emergency Medicine | Admitting: Emergency Medicine

## 2023-02-24 DIAGNOSIS — G43409 Hemiplegic migraine, not intractable, without status migrainosus: Secondary | ICD-10-CM | POA: Insufficient documentation

## 2023-02-24 DIAGNOSIS — G510 Bell's palsy: Secondary | ICD-10-CM | POA: Diagnosis not present

## 2023-02-24 DIAGNOSIS — M542 Cervicalgia: Secondary | ICD-10-CM | POA: Diagnosis not present

## 2023-02-24 DIAGNOSIS — R29810 Facial weakness: Secondary | ICD-10-CM | POA: Diagnosis present

## 2023-02-24 LAB — BASIC METABOLIC PANEL
Anion gap: 10 (ref 5–15)
BUN: 17 mg/dL (ref 6–20)
CO2: 22 mmol/L (ref 22–32)
Calcium: 9.3 mg/dL (ref 8.9–10.3)
Chloride: 105 mmol/L (ref 98–111)
Creatinine, Ser: 0.9 mg/dL (ref 0.44–1.00)
GFR, Estimated: >60 mL/min (ref >60)
Glucose, Bld: 85 mg/dL (ref 70–99)
Potassium: 3.9 mmol/L (ref 3.5–5.1)
Sodium: 137 mmol/L (ref 135–145)

## 2023-02-24 LAB — CBC WITH DIFFERENTIAL/PLATELET
Abs Immature Granulocytes: 0.01 10*3/uL (ref 0.00–0.07)
Basophils Absolute: 0.1 10*3/uL (ref 0.0–0.1)
Basophils Relative: 1 %
Eosinophils Absolute: 0.1 10*3/uL (ref 0.0–0.5)
Eosinophils Relative: 1 %
HCT: 43.3 % (ref 36.0–46.0)
Hemoglobin: 14.6 g/dL (ref 12.0–15.0)
Immature Granulocytes: 0 %
Lymphocytes Relative: 37 %
Lymphs Abs: 2.9 10*3/uL (ref 0.7–4.0)
MCH: 29.8 pg (ref 26.0–34.0)
MCHC: 33.7 g/dL (ref 30.0–36.0)
MCV: 88.4 fL (ref 80.0–100.0)
Monocytes Absolute: 0.4 10*3/uL (ref 0.1–1.0)
Monocytes Relative: 6 %
Neutro Abs: 4.4 10*3/uL (ref 1.7–7.7)
Neutrophils Relative %: 55 %
Platelets: 382 10*3/uL (ref 150–400)
RBC: 4.9 MIL/uL (ref 3.87–5.11)
RDW: 13.4 % (ref 11.5–15.5)
WBC: 7.8 10*3/uL (ref 4.0–10.5)
nRBC: 0 % (ref 0.0–0.2)

## 2023-02-24 LAB — CBG MONITORING, ED: Glucose-Capillary: 80 mg/dL (ref 70–99)

## 2023-02-24 NOTE — ED Triage Notes (Signed)
Pt to ED with family for right sided facial droop started within hour. Denies weakness, denies h/a. 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 Ambulatory, NAD noted C/o minor left sided neck pain States has been having nystagmus the past few days.

## 2023-02-24 NOTE — ED Provider Notes (Signed)
[facility] Emergency Department Provider Note     Event Date/Time   First MD Initiated Contact with Patient 02/24/23 1521     (approximate)   History   No chief complaint on file.   HPI  Kristin Holt is a 22 y.o. female with a history of complex migraine, tension headaches, anxiety, presents to the ED for evaluation of right-sided facial droop onset within the hour.  She denies any associated weakness, headache, vision loss, or distal paralysis.  No drooling, slurred speech, taste changes, or vertigo/tinnitus.  Patient will endorse similar symptoms when she has had hemiplegic migraines.  She would also note she has symptoms similar to this when she starts her menstrual period which she notes her LMP was yesterday.  She presents to the ED ambulatory, without any noted fevers, recent illness, slurred speech, nausea, vomiting, chest pain.  She does endorse some minor left-sided neck pain.  Patient would also note she has had some nystagmus for the past few days.    Physical Exam   Triage Vital Signs: ED Triage Vitals  Encounter Vitals Group     BP 02/24/23 1446 (!) 142/108     Systolic BP Percentile --      Diastolic BP Percentile --      Pulse Rate 02/24/23 1438 98     Resp 02/24/23 1438 16     Temp 02/24/23 1446 98.6 F (37 C)     Temp Source 02/24/23 1446 Oral     SpO2 02/24/23 1438 97 %     Weight 02/24/23 1438 135 lb (61.2 kg)     Height 02/24/23 1438 5\' 11"  (1.803 m)     Head Circumference --      Peak Flow --      Pain Score 02/24/23 1438 2     Pain Loc --      Pain Education --      Exclude from Growth Chart --     Most recent vital signs: Vitals:   02/24/23 1446 02/24/23 1553  BP: (!) 142/108 (!) 131/94  Pulse:  81  Resp:  18  Temp: 98.6 F (37 C)   SpO2:  98%    General Awake, no distress. NAD HEENT NCAT. PERRL. EOMI. No rhinorrhea. Mucous membranes are moist.  CV:  Good peripheral perfusion. RRR RESP:  Normal effort.  CTA ABD:  No distention.  MSK:  Normal active range of motion of all extremities NEURO: Cranial nerves II to XII grossly intact.  Normal UE/LE DTRs bilaterally.  No evidence of facial palsy.  Normal nasolabial folds.  No cerebellar ataxia appreciated.  Normal finger-nose exam.  No pronator drift noted.  Gait without ataxia.  ED Results / Procedures / Treatments   Labs (all labs ordered are listed, but only abnormal results are displayed) Labs Reviewed  CBC WITH DIFFERENTIAL/PLATELET  BASIC METABOLIC PANEL  CBG MONITORING, ED    EKG   RADIOLOGY   No results found.   PROCEDURES:  Critical Care performed: No  Procedures   MEDICATIONS ORDERED IN ED: Medications - No data to display   IMPRESSION / MDM / ASSESSMENT AND PLAN / ED COURSE  I reviewed the triage vital signs and the nursing notes.                              Differential diagnosis includes, but is not limited to, alcohol, illicit or prescription medications, or other  toxic ingestion; intracranial pathology such as stroke or intracerebral hemorrhage; fever or infectious causes including sepsis; CVA/TIA, facial nerve palsy, HSV, Lyme disease, uremia; trauma; endocrine related disorders such as diabetes, hypoglycemia, and thyroid-related diseases; hypertensive encephalopathy; etc.  Patient's presentation is most consistent with acute complicated illness / injury requiring diagnostic workup.  Patient's diagnosis is consistent with facial nerve palsy with resolution of symptoms.  Patient presents in no acute distress with some reported facial palsy primarily affecting the buccal/mandibular branches.  Patient symptoms at this point are resolved.  No other acute lab abnormalities are appreciated.  No indication of involvement of the forehead as there is no ptosis, lid lag, or forehead involvement.  Discussed the possibility of further imaging including CT head/CTA imaging to further delineate the source of her symptoms.   Patient is decided at this time she feels much improved and that her symptoms likely represent may be a combination of a mild headache syndrome or her menstrual syndrome.  Patient is to follow up with her PCP as discussed, as needed or otherwise directed. Patient is given ED precautions to return to the ED for any worsening or new symptoms.   FINAL CLINICAL IMPRESSION(S) / ED DIAGNOSES   Final diagnoses:  Facial palsy  Hemiplegic migraine without status migrainosus, not intractable     Rx / DC Orders   ED Discharge Orders     None        Note:  This document was prepared using Dragon voice recognition software and may include unintentional dictation errors.    Lissa Hoard, PA-C 02/26/23 0114    Janith Lima, MD 02/28/23 [phone]

## 2023-02-24 NOTE — ED Notes (Addendum)
Discussed pt with Dr Cyril Loosen, orders placed. No orders for CT at this time

## 2023-02-24 NOTE — Discharge Instructions (Addendum)
Your exam, labs, and symptoms all seem to be stable and resolved at this time.  Your symptoms may represent a complex migraine syndrome.  Take your home medications as prescribed.  Follow-up primary provider for ongoing concerns.  Return to the ED as discussed.
